# Patient Record
Sex: Male | Born: 2000 | Race: White | Hispanic: No | Marital: Single | Smoking: Never smoker
Health system: Southern US, Community
[De-identification: ages and names within clinical notes are randomized; demographics above are authoritative.]

---
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
## 2019-10-20 IMAGING — CT CT HEAD WITHOUT CONTRAST
3 of 7 series · 15 of 47 positions shown, 18 images · non-contrast
Comparison: None.

CLINICAL DATA: 17-year-old male status post fall 2 days ago
snowboarding. Pain and swelling of the nose and upper lip.

EXAM:
CT HEAD WITHOUT CONTRAST
CT MAXILLOFACIAL WITHOUT CONTRAST
TECHNIQUE: Multidetector CT imaging of the head and maxillofacial structures
were performed using the standard protocol without intravenous
contrast. Multiplanar CT image reconstructions of the maxillofacial
structures were also generated.

[Series 3: max soft · axial · 0.36mm/px · z∈[-297,-137]mm · 11 of 90 slices shown, 14 images]
[im 5/90  brain]
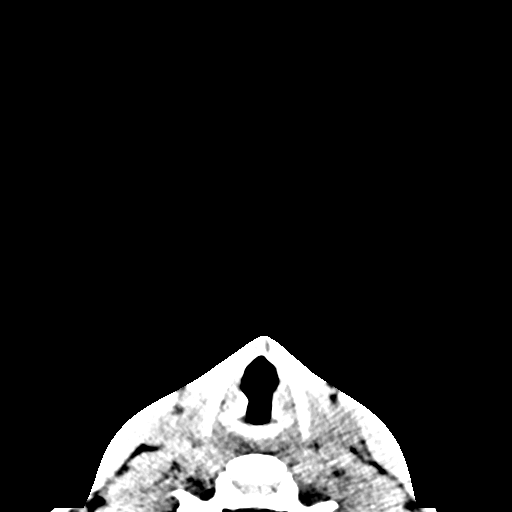
[im 5/90  bone]
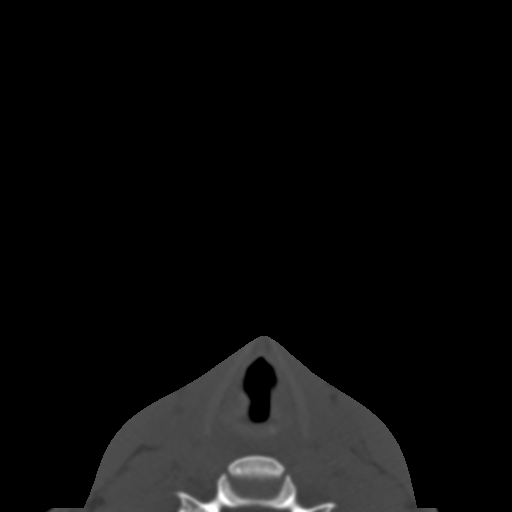
[im 13/90  brain]
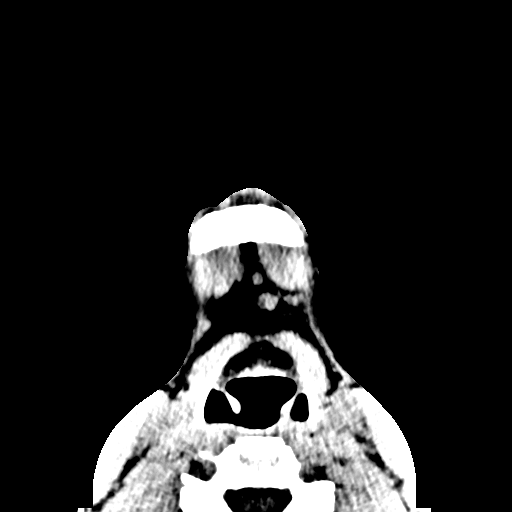
[im 22/90  brain]
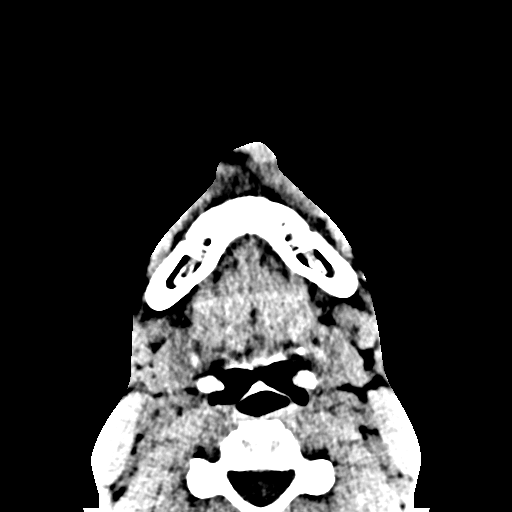
[im 30/90  brain]
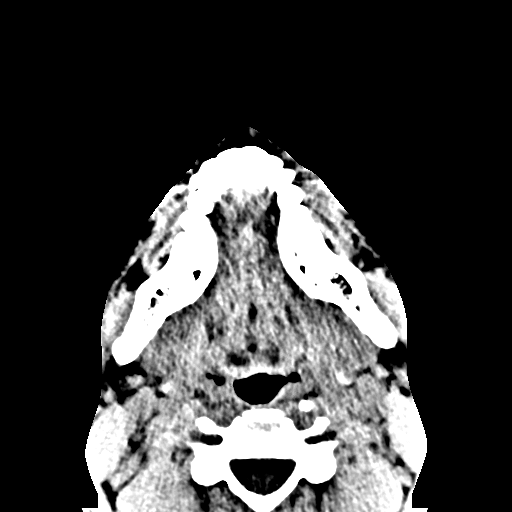
[im 39/90  brain]
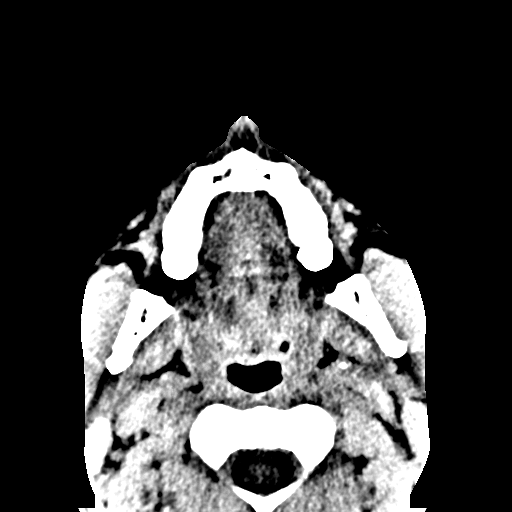
[im 39/90  bone]
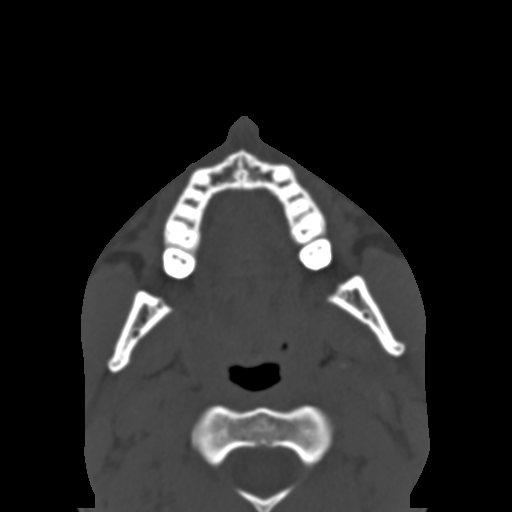
[im 47/90  brain]
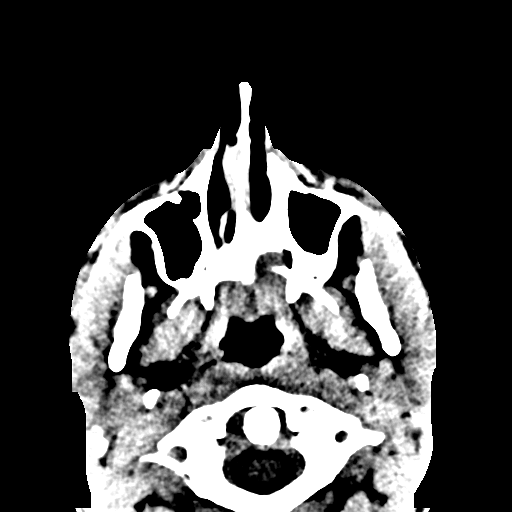
[im 51/90  brain]
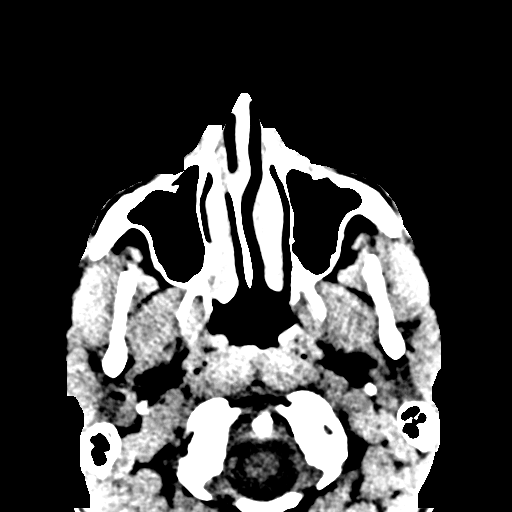
[im 60/90  brain]
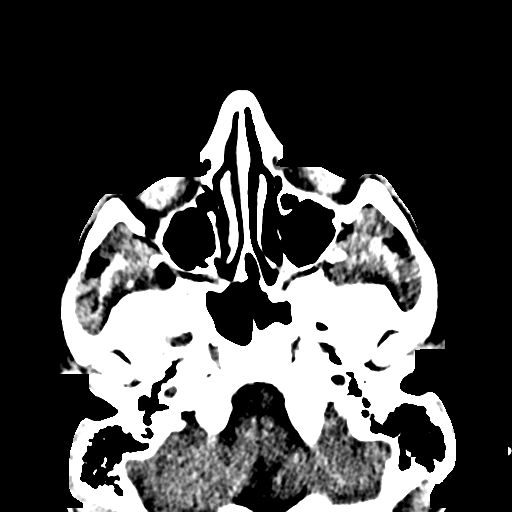
[im 68/90  brain]
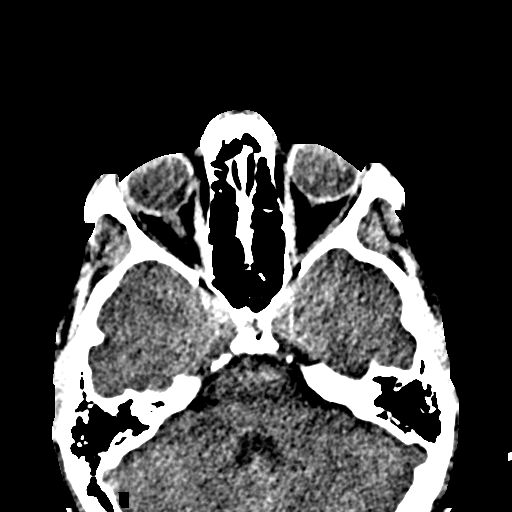
[im 68/90  bone]
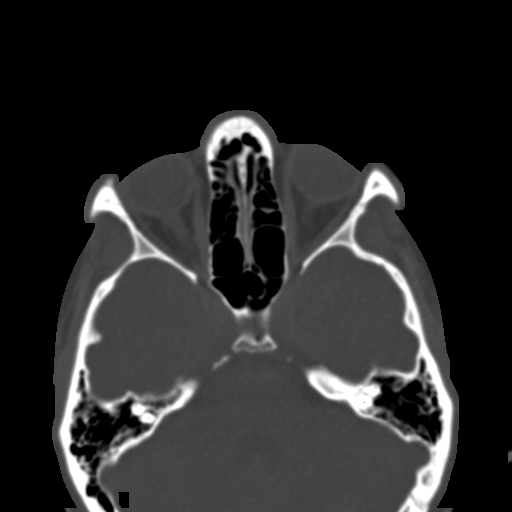
[im 77/90  brain]
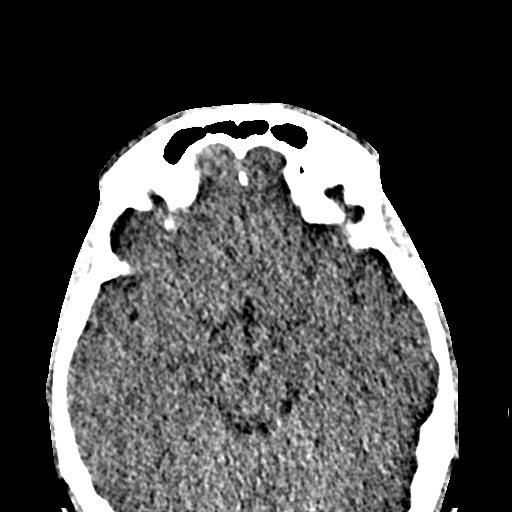
[im 85/90  brain]
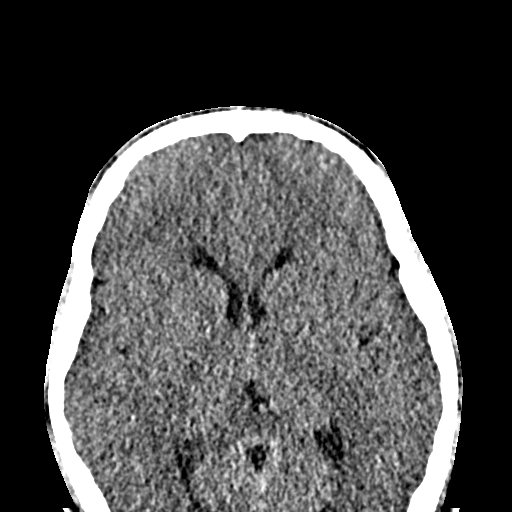

[Series 5: coronal soft · coronal · 0.37mm/px · 3 of 73 slices shown]
[im 19/73  brain]
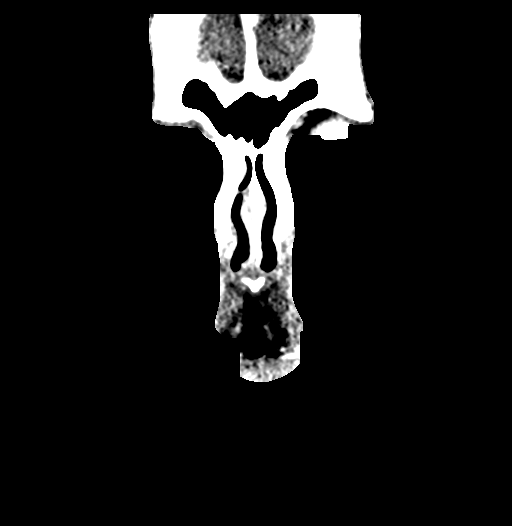
[im 37/73  brain]
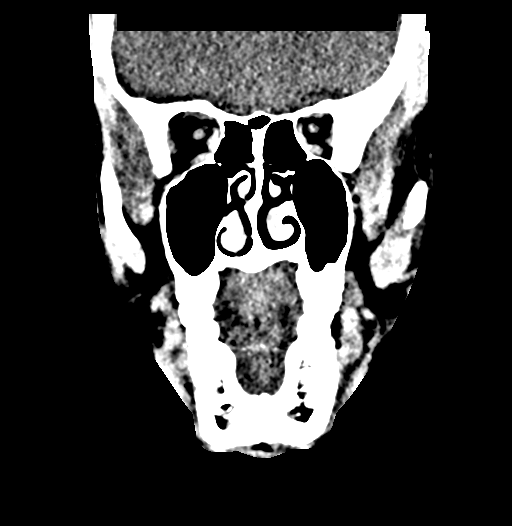
[im 55/73  brain]
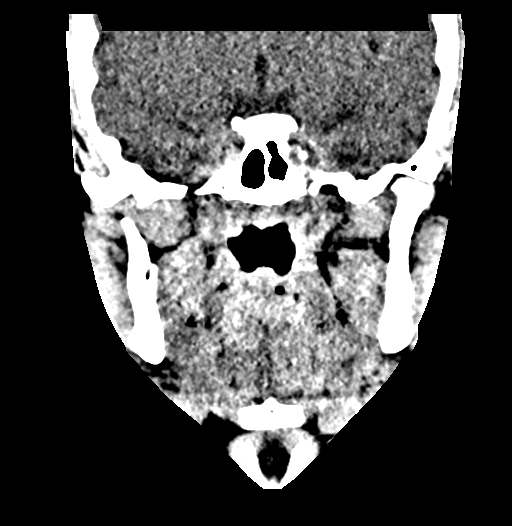

[Series 7: sagittal soft · sagittal · 0.37mm/px · 1 of 77 slices shown]
[im 39/77  brain]
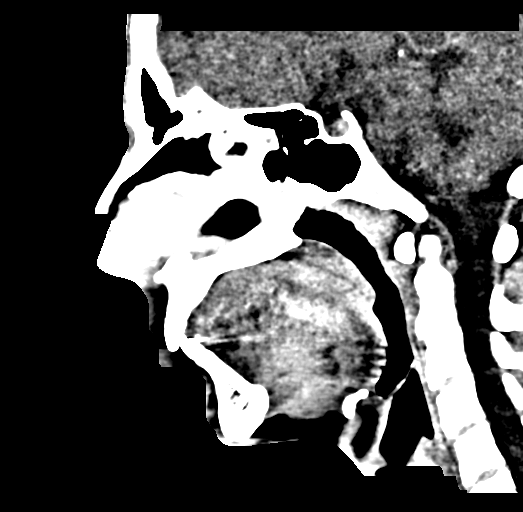

[15 of 47 positions shown; findings below may reference images not displayed]

FINDINGS: CT HEAD FINDINGS

Brain: No midline shift, ventriculomegaly, mass effect, evidence of
mass lesion, intracranial hemorrhage or evidence of cortically based
acute infarction. Gray-white matter differentiation is within normal
limits throughout the brain.

Vascular: No suspicious intracranial vascular hyperdensity.

Skull: No skull fracture identified.

Other: Tympanic cavities are clear. Left mastoids are clear. There
is trace fluid or opacity in the inferior right mastoids (series 12,
image 3).

No scalp laceration or hematoma identified.

CT MAXILLOFACIAL FINDINGS

Osseous: Mandible intact. Normal TMJ alignment. No definite maxilla
fracture. The anterior maxillary spine and nasal processes of the
maxilla are intact. No definite nasal bone fracture.

Intact zygoma. No acute dental finding identified. Central skull
base and visible upper cervical spine appear intact.

Orbits: Intact orbital walls. Negative orbits soft tissues.

Sinuses: Clear. Hyperplastic sinuses especially the right sphenoid.
Rightward nasal septal deviation and spurring appears chronic.

Soft tissues: Negative visible noncontrast deep soft tissue spaces
including larynx, pharynx, parapharyngeal, retropharyngeal,
sublingual, submandibular, masticator, and parotid spaces. Upper
cervical lymph nodes appear symmetric and within normal limits.

There is soft tissue swelling about the anterior mouth, primarily
the lower lip (series 3, image 23). No soft tissue gas. No soft
tissue swelling overlying the nasal bones.
IMPRESSION: 1. Soft tissue swelling about the lower lip but no convincing face
or skull fracture.
2.  Normal noncontrast CT appearance of the brain.
3. Trace right mastoid fluid, likely postinflammatory and unrelated
to the recent trauma.
